# Patient Record
Sex: Female | Born: 2000 | Race: Black or African American | Hispanic: No | Marital: Single
Health system: Midwestern US, Community
[De-identification: ages and names within clinical notes are randomized; demographics above are authoritative.]

---

## 2020-02-08 ENCOUNTER — Inpatient Hospital Stay
Admit: 2020-02-08 | Discharge: 2020-02-08 | Disposition: A | Discharge status: Home / Self Care | Payer: Medicaid - Out of State | Attending: Emergency Medicine

## 2020-02-08 DIAGNOSIS — L089 Local infection of the skin and subcutaneous tissue, unspecified: Secondary | ICD-10-CM

## 2020-02-08 MED ORDER — CEPHALEXIN 500 MG CAP
500 mg | ORAL_CAPSULE | Freq: Four times a day (QID) | ORAL | 0 refills | Status: AC
Start: 2020-02-08 — End: 2020-02-15

## 2020-02-08 NOTE — ED Notes (Signed)
Patient presents to ED with C/O left eye pain with a raised area and swelling that started a couple of days ago. The pt stated the raised area in the corner of her eye started to drain yesterday.  Patient is A&Ox4 and aware of plan of care. The patient is in NAD.

## 2020-02-08 NOTE — ED Notes (Signed)
Patient discharged and given discharge instructions. Patient had an opportunity to ask questions. Patient verbalized understanding of discharge instructions.  Patient d/c from ED ambulatory, discharge instructions and prescriptions in hand. Patient accompanied by self.

## 2020-02-08 NOTE — ED Triage Notes (Signed)
Pt in with bump in corner of left eye. Pt states it started with a white head on the bump x 2 days ago, pain around eye. Pt states she applied warm compress and area drained.

## 2020-02-08 NOTE — ED Provider Notes (Signed)
EMERGENCY DEPARTMENT HISTORY AND PHYSICAL EXAM    Date: 02/08/2020  Patient Name: Darlene Bell    History of Presenting Illness     Chief Complaint   Patient presents with   ??? Eye Pain         History Provided By: Patient    Chief Complaint: eye pain  Duration: 2 Days  Timing:  Acute  Location: left outer eye  Quality: Aching  Severity: Mild  Modifying Factors: none  Associated Symptoms: lesion inner canthus       HPI: Darlene Bell is a 19 y.o. female with a PMH of No significant past medical history who presents with patient left inner canthus.  Patient states she has had pain on her eyelid and under her eye and inner canthus area which started 2 days ago.  Patient states she noticed which she thought was a stye in her right inner eye but states it burst but is still painful.  Patient denies sensation of foreign body in her eye.  She denies injury to her eye.    PCP: Other, Phys, MD    Current Outpatient Medications   Medication Sig Dispense Refill   ??? levetiracetam (KEPPRA PO) Take 1,000 mg by mouth two (2) times a day.     ??? cephALEXin (Keflex) 500 mg capsule Take 1 Cap by mouth four (4) times daily for 7 days. 28 Cap 0       Past History     Past Medical History:  Past Medical History:   Diagnosis Date   ??? Seizures (HCC)        Past Surgical History:  History reviewed. No pertinent surgical history.    Family History:  History reviewed. No pertinent family history.    Social History:  Social History     Tobacco Use   ??? Smoking status: Never Smoker   Substance Use Topics   ??? Alcohol use: Never     Frequency: Never   ??? Drug use: Never       Allergies:  No Known Allergies      Review of Systems   Review of Systems   Constitutional: Negative for fatigue and fever.   Eyes: Positive for pain.        Eye lesion   Respiratory: Negative for shortness of breath and wheezing.    Cardiovascular: Negative for chest pain.   Gastrointestinal: Negative for abdominal pain.   Musculoskeletal: Negative for arthralgias,  myalgias, neck pain and neck stiffness.   Skin: Negative for pallor and rash.   Neurological: Negative for dizziness, tremors and headaches.   All other systems reviewed and are negative.      Physical Exam     Vitals:    02/08/20 1549   BP: (!) 146/81   Pulse: 76   Resp: 18   Temp: 98 ??F (36.7 ??C)   SpO2: 97%   Weight: 112 kg (247 lb)   Height: 5\' 6"  (1.676 m)     Physical Exam  Vitals signs and nursing note reviewed.   Constitutional:       General: She is not in acute distress.     Appearance: She is well-developed.   HENT:      Head: Normocephalic and atraumatic.      Right Ear: External ear normal.      Left Ear: External ear normal.      Nose: Nose normal.      Mouth/Throat:      Mouth: Mucous membranes are  moist.   Eyes:      General:         Right eye: No discharge.         Left eye: No discharge.      Extraocular Movements: Extraocular movements intact.      Conjunctiva/sclera: Conjunctivae normal.      Pupils: Pupils are equal, round, and reactive to light.     Neck:      Musculoskeletal: Normal range of motion and neck supple.   Cardiovascular:      Rate and Rhythm: Normal rate and regular rhythm.      Heart sounds: Normal heart sounds.   Pulmonary:      Effort: Pulmonary effort is normal. No respiratory distress.      Breath sounds: Normal breath sounds. No wheezing.   Abdominal:      General: Bowel sounds are normal.      Palpations: Abdomen is soft.      Tenderness: There is no abdominal tenderness.   Musculoskeletal: Normal range of motion.   Lymphadenopathy:      Cervical: No cervical adenopathy.   Skin:     General: Skin is warm and dry.      Findings: No rash.   Neurological:      Mental Status: She is alert and oriented to person, place, and time.      Cranial Nerves: No cranial nerve deficit.      Coordination: Coordination normal.   Psychiatric:         Behavior: Behavior normal.         Thought Content: Thought content normal.         Judgment: Judgment normal.           Diagnostic Study  Results     Labs -   No results found for this or any previous visit (from the past 12 hour(s)).    Radiologic Studies -   No orders to display     CT Results  (Last 48 hours)    None        CXR Results  (Last 48 hours)    None            Medical Decision Making   I am the first provider for this patient.    I reviewed the vital signs, available nursing notes, past medical history, past surgical history, family history and social history.    Vital Signs-Reviewed the patient's vital signs.    Records Reviewed: Nursing Notes    Provider Notes (Medical Decision Making):   DDX chalazion hordeolum abscess infected pimple abrasion          Disposition:  home    DISCHARGE NOTE:     I have discussed with patient their diagnosis, treatment, and follow up plan. The patient agrees to follow up as outlined in discharge paperwork and also to return to the ED with any worsening. Ottie Glazier, NP        Follow-up Information     Follow up With Specialties Details Why [facility] In 1 week  [address]  [place]  [phone]          Current Discharge Medication List      START taking these medications    Details   cephALEXin (Keflex) 500 mg capsule Take 1 Cap by mouth four (4) times daily for 7 days.  Qty: 28 Cap,  Refills: 0             Procedures:  Procedures    Please note that this dictation was completed with Dragon, Advertising account planner.  Quite often unanticipated grammatical, syntax, homophones, and other interpretive errors are inadvertently transcribed by the computer software.  Please disregard these errors.  Additionally, please excuse any errors that have escaped final proofreading.    Diagnosis     Clinical Impression:   1. Infected lesion of skin

## 2020-02-08 NOTE — ED Notes (Signed)
Emergency Department Nursing Plan of Care       The Nursing Plan of Care is developed from the Nursing assessment and Emergency Department Attending provider initial evaluation.  The plan of care may be reviewed in the ???ED Provider note???.    The Plan of Care was developed with the following considerations:   Patient / Family readiness to learn indicated by:verbalized understanding  Persons(s) to be included in education: patient  Barriers to Learning/Limitations:No    Signed     Shannon D Ward, RN    02/08/2020   4:31 PM

## 2020-02-08 NOTE — ED Provider Notes (Signed)
ED Provider Notes by Ellsworth Lennox, NP at 02/08/20 1619                Author: Ellsworth Lennox, NP  Service: Emergency Medicine  Author Type: Nurse Practitioner       Filed: 02/08/20 1845  Date of Service: 02/08/20 1619  Status: Attested           Editor: Ellsworth Lennox, NP (Nurse Practitioner)  Cosigner: Ottis Stain, MD at 02/08/20 2030          Attestation signed by Ottis Stain, MD at 02/08/20 2030          I was personally available for consultation in the emergency department.  I have reviewed the chart and agree with the documentation recorded by the mid level  provider, including the assessment, treatment plan, and disposition.   Ottis Stain, MD                                    EMERGENCY DEPARTMENT HISTORY AND PHYSICAL EXAM      Date: 02/08/2020   Patient Name: Darlene Bell        History of Presenting Illness          Chief Complaint       Patient presents with        ?  Eye Pain              History Provided By: Patient      Chief Complaint: eye pain   Duration: 2 Days   Timing:  Acute   Location: left outer eye   Quality: Aching   Severity: Mild   Modifying Factors: none   Associated Symptoms: lesion inner canthus          HPI: Darlene Bell is a  19 y.o. female with a PMH of No  significant past medical history who presents with patient left inner canthus.  Patient states she has had pain on her eyelid and under her eye and inner canthus area which started 2 days ago.  Patient  states she noticed which she thought was a stye in her right inner eye but states it burst but is still painful.  Patient denies sensation of foreign body in her eye.  She denies injury to her eye.      PCP: Other, Phys, MD        Current Outpatient Medications          Medication  Sig  Dispense  Refill           ?  levetiracetam (KEPPRA PO)  Take 1,000 mg by mouth two (2) times a day.               ?  cephALEXin (Keflex) 500 mg capsule  Take 1 Cap by mouth four (4) times daily for 7 days.   28 Cap  0             Past History        Past Medical History:     Past Medical History:        Diagnosis  Date         ?  Seizures (HCC)             Past Surgical History:   History reviewed. No pertinent surgical history.      Family History:   History reviewed. No  pertinent family history.      Social History:     Social History          Tobacco Use         ?  Smoking status:  Never Smoker       Substance Use Topics         ?  Alcohol use:  Never              Frequency:  Never         ?  Drug use:  Never           Allergies:   No Known Allergies           Review of Systems     Review of Systems    Constitutional: Negative for fatigue and fever.    Eyes: Positive for pain.         Eye lesion    Respiratory: Negative for shortness of breath and wheezing.     Cardiovascular: Negative for chest pain.    Gastrointestinal: Negative for abdominal pain.    Musculoskeletal: Negative for arthralgias, myalgias, neck pain and neck stiffness.    Skin: Negative for pallor and rash.    Neurological: Negative for dizziness, tremors and headaches.    All other systems reviewed and are negative.           Physical Exam          Vitals:          02/08/20 1549        BP:  (!) 146/81     Pulse:  76     Resp:  18     Temp:  98 ??F (36.7 ??C)     SpO2:  97%     Weight:  112 kg (247 lb)        Height:  5\' 6"  (1.676 m)        Physical Exam   Vitals signs and nursing note reviewed.   Constitutional:        General: She is not in acute distress.     Appearance: She is well-developed.    HENT:       Head: Normocephalic and atraumatic.      Right Ear: External ear normal.      Left Ear: External ear normal.      Nose: Nose normal.      Mouth/Throat:      Mouth: Mucous membranes are moist.   Eyes:       General:         Right eye: No discharge.         Left eye: No discharge.      Extraocular Movements: Extraocular movements intact.      Conjunctiva/sclera: Conjunctivae normal.      Pupils: Pupils are equal, round, and reactive  to light.        Neck :       Musculoskeletal: Normal range of motion and neck supple.   Cardiovascular :       Rate and Rhythm: Normal rate and regular rhythm.      Heart sounds: Normal heart sounds.    Pulmonary:       Effort: Pulmonary effort is normal. No respiratory distress.      Breath sounds: Normal breath sounds. No wheezing.    Abdominal:      General: Bowel sounds are normal.      Palpations: Abdomen is soft.  Tenderness: There is no abdominal tenderness.     Musculoskeletal: Normal range of motion.     Lymphadenopathy:       Cervical: No cervical adenopathy.   Skin :      General: Skin is warm and dry.      Findings: No rash.    Neurological:       Mental Status: She is alert and oriented to person, place, and time.      Cranial Nerves: No cranial nerve deficit.      Coordination: Coordination normal.   Psychiatric:         Behavior: Behavior normal.         Thought Content: Thought content normal.          Judgment: Judgment normal.                  Diagnostic Study Results        Labs -    No results found for this or any previous visit (from the past 12 hour(s)).      Radiologic Studies -      No orders to display          CT Results   (Last 48 hours)          None                 CXR Results   (Last 48 hours)          None                       Medical Decision Making     I am the first provider for this patient.      I reviewed the vital signs, available nursing notes, past medical history, past surgical history, family history and social history.      Vital Signs-Reviewed the patient's vital signs.      Records Reviewed: Nursing Notes      Provider Notes (Medical Decision Making):    DDX chalazion hordeolum abscess infected pimple abrasion              Disposition:   home      DISCHARGE NOTE:       I have discussed with patient their diagnosis, treatment, and follow up plan. The patient agrees to follow up as outlined in discharge paperwork and also to return to the ED with any worsening.  Ellsworth Lennox,  NP              Follow-up Information               Follow up With  Specialties  Details  Why  Contact Info              [facility]  In 1 week    [address]   [address] [place] [address]   [phone]                  Current Discharge Medication List              START taking these medications          Details        cephALEXin (Keflex) 500 mg capsule  Take 1 Cap by mouth four (4) times daily for 7 days.   Qty: 28 Cap, Refills:  0  Procedures:   Procedures      Please note that this dictation was completed with Dragon, Advertising account planner.  Quite often unanticipated grammatical, syntax, homophones, and other interpretive errors are inadvertently transcribed by the computer software.  Please disregard  these errors.  Additionally, please excuse any errors that have escaped final proofreading.        Diagnosis        Clinical Impression:       1.  Infected lesion of skin

## 2020-02-08 NOTE — ED Notes (Signed)
 Emergency Department Nursing Plan of Care       The Nursing Plan of Care is developed from the Nursing assessment and Emergency Department Attending provider initial evaluation.  The plan of care may be reviewed in the "ED Provider note".    The Plan of Care was developed with the following considerations:   Patient / Family readiness to learn indicated ab:czmajopszi understanding  Persons(s) to be included in education: patient  Barriers to Learning/Limitations:No    Signed     Clotilda BIRCH Ward, RN    02/08/2020   4:31 PM

## 2020-02-08 NOTE — ED Notes (Signed)
Patient presents to ED with C/O left eye pain with a raised area and swelling that started a couple of days ago. The pt stated the raised area in the corner of her eye started to drain yesterday.  Patient is A&Ox4 and aware of plan of care. The patient is in NAD.

## 2020-02-08 NOTE — ED Notes (Signed)
Pt in with bump in corner of left eye. Pt states it started with a white head on the bump x 2 days ago, pain around eye. Pt states she applied warm compress and area drained.

## 2022-02-02 ENCOUNTER — Emergency Department (HOSPITAL_COMMUNITY)
Admission: EM | Admit: 2022-02-02 | Discharge: 2022-02-02 | Discharge status: Against Medical Advice | Payer: Medicaid Other | Attending: Medical | Admitting: Medical

## 2022-02-02 ENCOUNTER — Emergency Department (HOSPITAL_COMMUNITY): Payer: Medicaid Other

## 2022-02-02 DIAGNOSIS — Z5321 Procedure and treatment not carried out due to patient leaving prior to being seen by health care provider: Secondary | ICD-10-CM | POA: Diagnosis not present

## 2022-02-02 DIAGNOSIS — R519 Headache, unspecified: Secondary | ICD-10-CM | POA: Diagnosis not present

## 2022-02-02 DIAGNOSIS — R569 Unspecified convulsions: Secondary | ICD-10-CM | POA: Insufficient documentation

## 2022-02-02 LAB — CBC WITH DIFFERENTIAL/PLATELET
Abs Immature Granulocytes: 0.02 10*3/uL (ref 0.00–0.07)
Basophils Absolute: 0.1 10*3/uL (ref 0.0–0.1)
Basophils Relative: 1 %
Eosinophils Absolute: 0.4 10*3/uL (ref 0.0–0.5)
Eosinophils Relative: 5 %
HCT: 32.6 % — ABNORMAL LOW (ref 36.0–46.0)
Hemoglobin: 9.4 g/dL — ABNORMAL LOW (ref 12.0–15.0)
Immature Granulocytes: 0 %
Lymphocytes Relative: 29 %
Lymphs Abs: 2 10*3/uL (ref 0.7–4.0)
MCH: 23.4 pg — ABNORMAL LOW (ref 26.0–34.0)
MCHC: 28.8 g/dL — ABNORMAL LOW (ref 30.0–36.0)
MCV: 81.3 fL (ref 80.0–100.0)
Monocytes Absolute: 0.7 10*3/uL (ref 0.1–1.0)
Monocytes Relative: 11 %
Neutro Abs: 3.7 10*3/uL (ref 1.7–7.7)
Neutrophils Relative %: 54 %
Platelets: 339 10*3/uL (ref 150–400)
RBC: 4.01 MIL/uL (ref 3.87–5.11)
RDW: 17.1 % — ABNORMAL HIGH (ref 11.5–15.5)
WBC: 6.9 10*3/uL (ref 4.0–10.5)
nRBC: 0 % (ref 0.0–0.2)

## 2022-02-02 LAB — BASIC METABOLIC PANEL
Anion gap: 7 (ref 5–15)
BUN: 6 mg/dL (ref 6–20)
CO2: 22 mmol/L (ref 22–32)
Calcium: 9 mg/dL (ref 8.9–10.3)
Chloride: 110 mmol/L (ref 98–111)
Creatinine, Ser: 0.78 mg/dL (ref 0.44–1.00)
GFR, Estimated: >60 mL/min (ref >60)
Glucose, Bld: 83 mg/dL (ref 70–99)
Potassium: 3.9 mmol/L (ref 3.5–5.1)
Sodium: 139 mmol/L (ref 135–145)

## 2022-02-02 LAB — RAPID URINE DRUG SCREEN, HOSP PERFORMED
Amphetamines: NOT DETECTED
Barbiturates: NOT DETECTED
Benzodiazepines: NOT DETECTED
Cocaine: NOT DETECTED
Opiates: NOT DETECTED
Tetrahydrocannabinol: POSITIVE — AB

## 2022-02-02 LAB — I-STAT BETA HCG BLOOD, ED (MC, WL, AP ONLY): I-stat hCG, quantitative: <5 m[IU]/mL (ref <5)

## 2022-02-02 LAB — MAGNESIUM: Magnesium: 2 mg/dL (ref 1.7–2.4)

## 2022-02-02 NOTE — ED Provider Triage Note (Signed)
Emergency Medicine Provider Triage Evaluation Note ? ?Meagan Reed , a 21 y.o. female  was evaluated in triage.  Pt complains of seizure like activity that occurred this morning around 5 AM. Significant other reports they were laying in bed when full body seizure occurred, lasting approximately 2-3 minutes with post ictal state. No tongue trauma or urinary incontinence. Pt reports being taken off of Keppra in February of this year - per chart review admitted to [facility] for seizure like activity however EEG without same. 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 Increased laxapro and prazosin. Pt compliant with same.  ? ?Pt currently complaining of a headache.  ? ?Review of Systems  ?Positive: + seizure, headache ?Negative: - tongue trauma, urinary incontinence, weakness/numbness ? ?Physical Exam  ?BP 119/76 (BP Location: Left Arm)   Pulse 77   Temp 98.4 ?F (36.9 ?C) (Oral)   Resp 17   Ht 5\' 6"  (1.676 m)   Wt 122.5 kg   LMP 12/18/2021   SpO2 99%   BMI 43.58 kg/m?  ?Gen:   Awake, no distress   ?Resp:  Normal effort  ?MSK:   Moves extremities without difficulty  ?Other:   ? ?Medical Decision Making  ?Medically screening exam initiated at 2:02 PM.  Appropriate orders placed.  Meagan Reed was informed that the remainder of the evaluation will be completed by another provider, this initial triage assessment does not replace that evaluation, and the importance of remaining in the ED until their evaluation is complete. ? ? ?  ?Meagan Lusher, PA-C ?02/02/22 1405 ? ?

## 2022-02-02 NOTE — ED Triage Notes (Signed)
Pt. Stated, I had a seizure was this morning around 530 and Ive got headache and rt. Side pain. They told me in March to stop taking the Keppra and dx me with FND a neurological disorder. ?

## 2023-04-15 IMAGING — CT CT HEAD W/O CM
4 series · 16 of 47 positions shown, 18 images · non-contrast
Comparison: None.

CLINICAL DATA: Headache, seizures.



[Series 3: head without · axial · non-contrast · 0.43mm/px · z∈[-87,+33]mm · 7 of 34 slices shown, 9 images]
[im 5/34  brain]
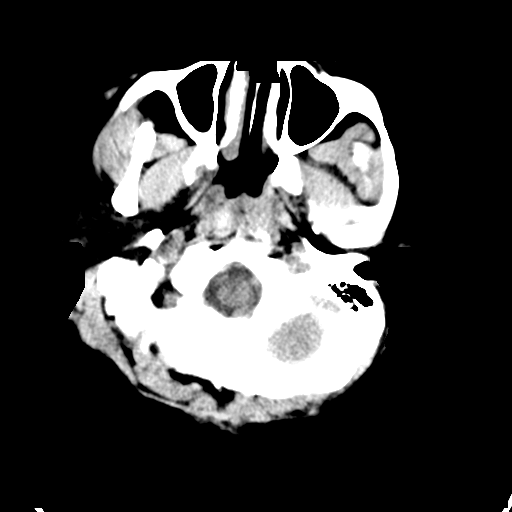
[im 5/34  bone]
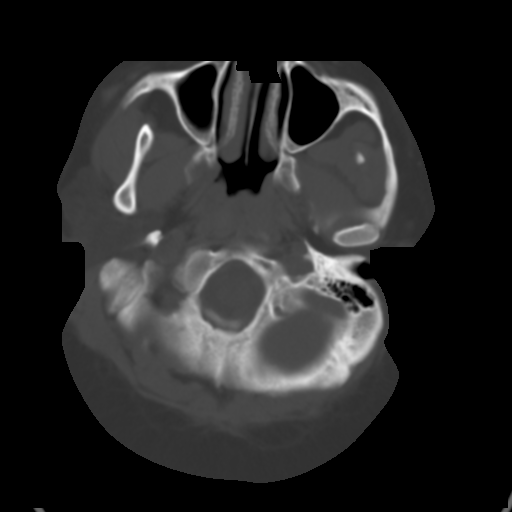
[im 9/34  brain]
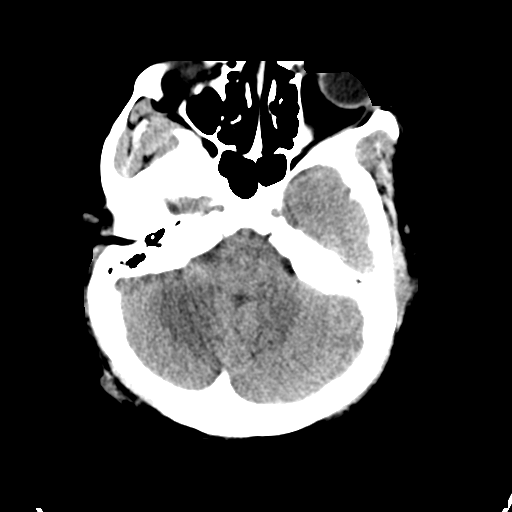
[im 13/34  brain]
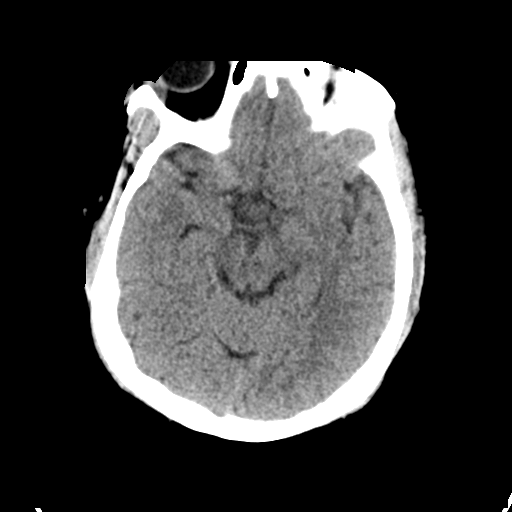
[im 17/34  brain]
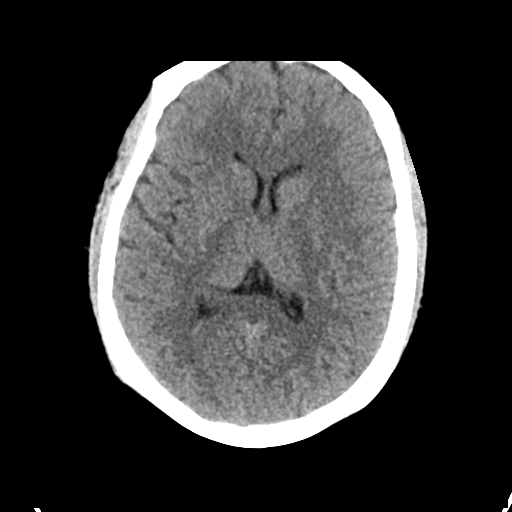
[im 21/34  brain]
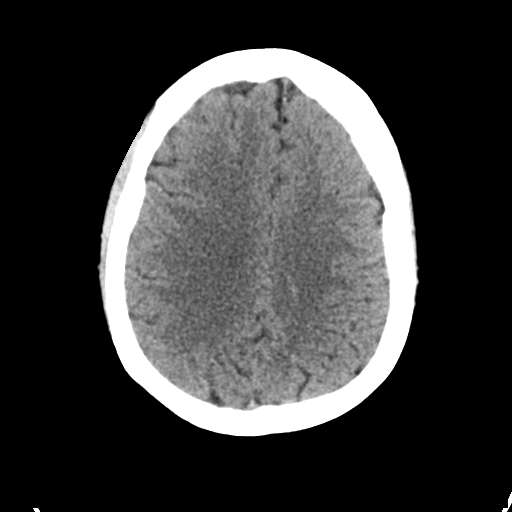
[im 21/34  bone]
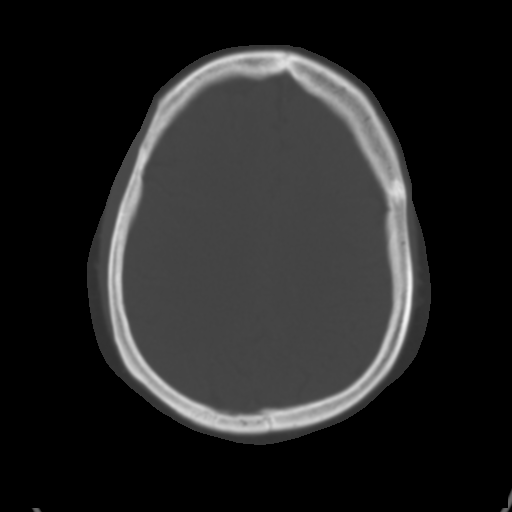
[im 25/34  brain]
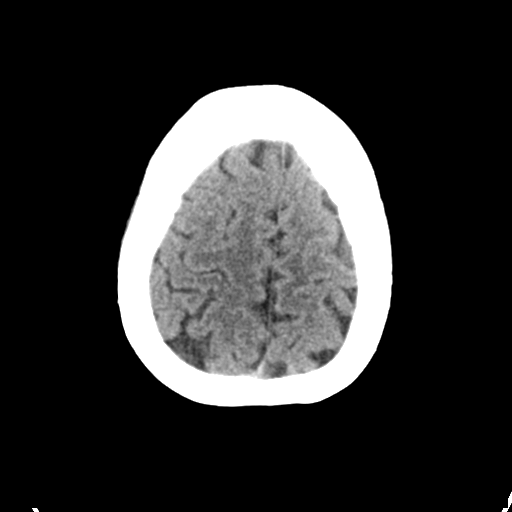
[im 29/34  brain]
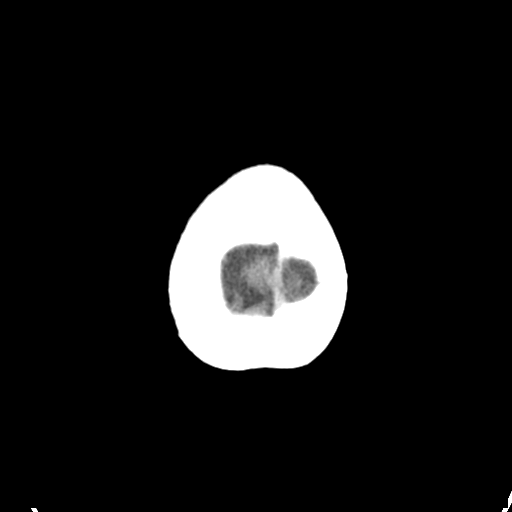

[Series 4: head bone · axial · 0.43mm/px · z∈[-91,-59]mm · 3 of 83 slices shown]
[im 9/83  bone]
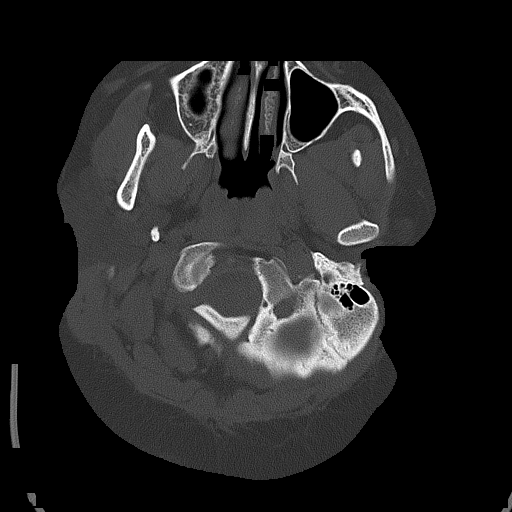
[im 17/83  bone]
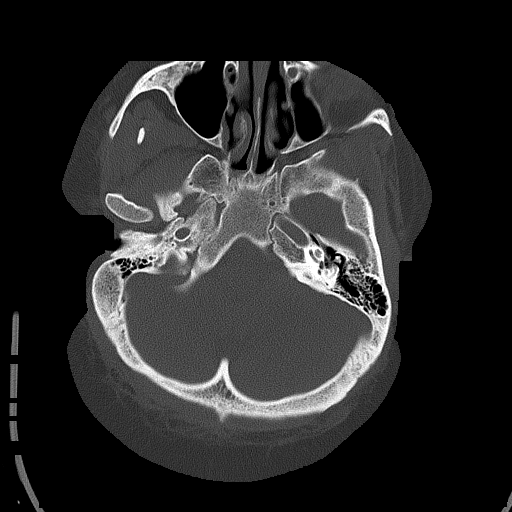
[im 25/83  bone]
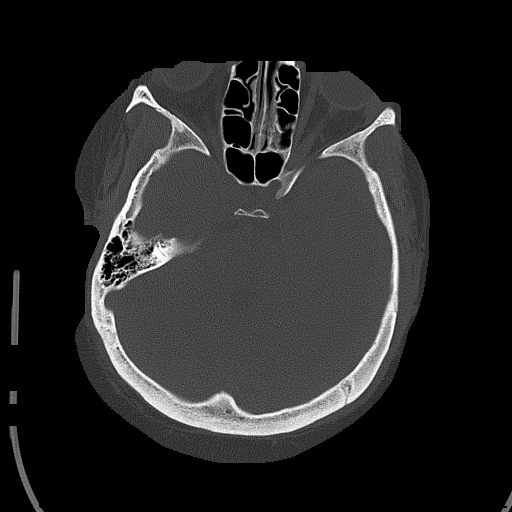

[Series 5: head without cor · coronal · non-contrast · 0.38mm/px · 3 of 67 slices shown]
[im 23/67  brain]
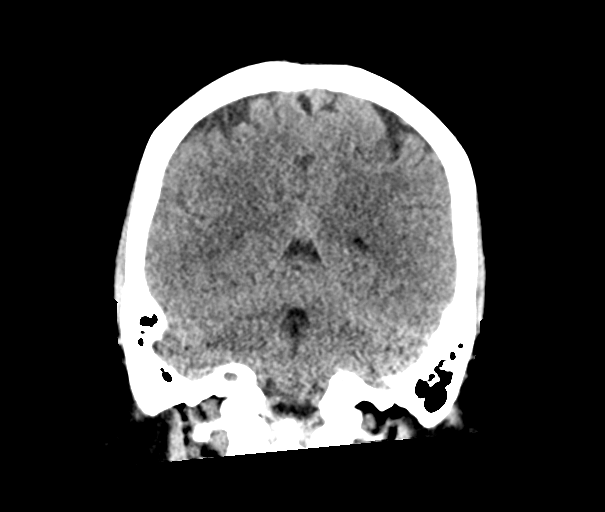
[im 30/67  brain]
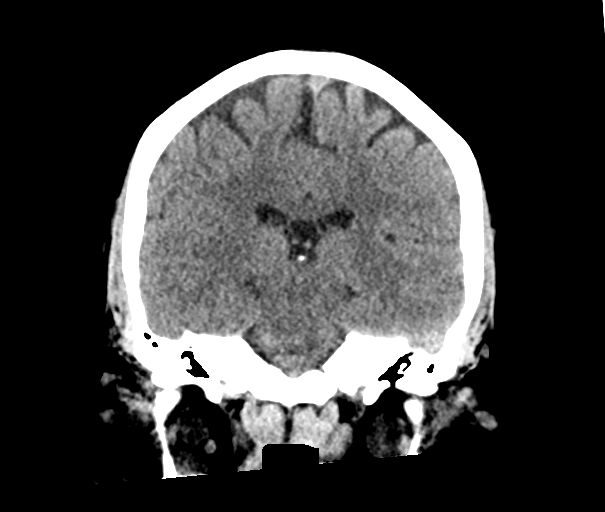
[im 37/67  brain]
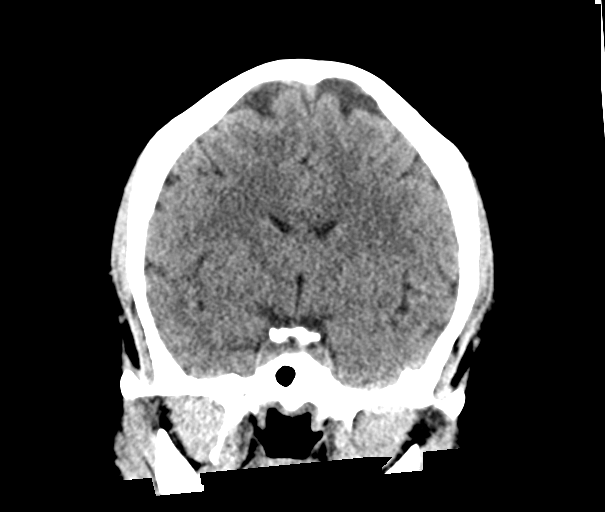

[Series 6: head without sag · sagittal · non-contrast · 0.37mm/px · 3 of 67 slices shown]
[im 23/67  brain]
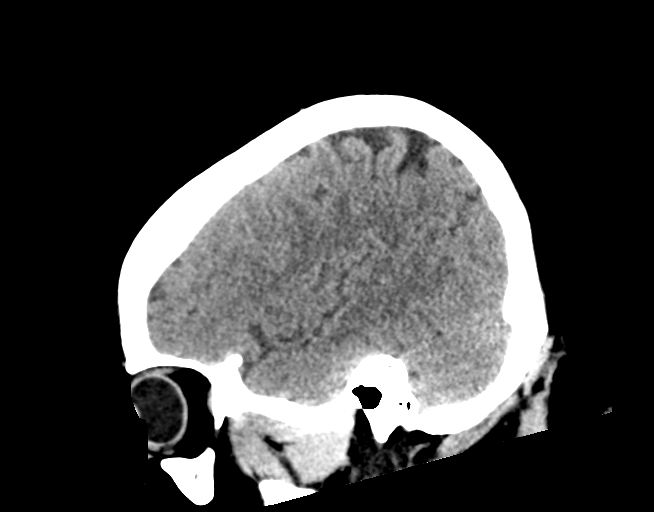
[im 34/67  brain]
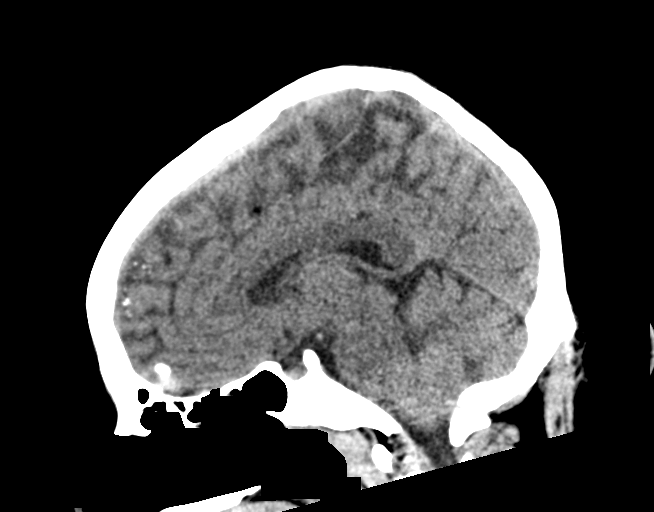
[im 45/67  brain]
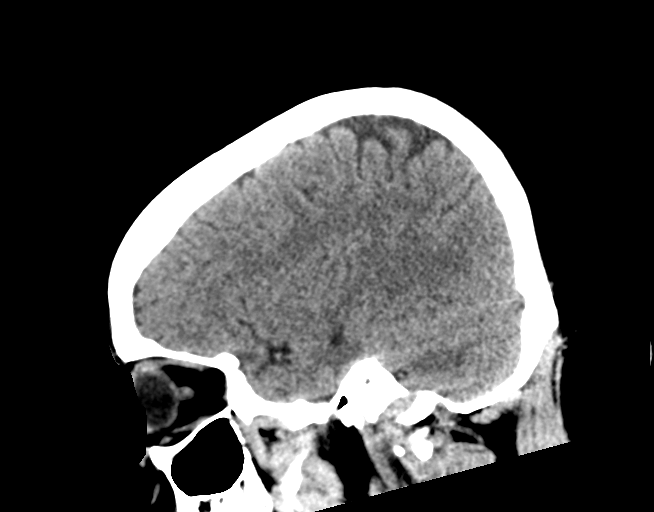

[16 of 47 positions shown; findings below may reference images not displayed]

FINDINGS: Brain: No evidence of acute infarction, hemorrhage, hydrocephalus,
extra-axial collection or mass lesion/mass effect.

Vascular: No hyperdense vessel or unexpected calcification.

Skull: Normal. Negative for fracture or focal lesion.

Sinuses/Orbits: No acute finding.

Other: None.
IMPRESSION: No acute intracranial abnormality.
